# Patient Record
Sex: Male | Born: 1960
Health system: Southern US, Community
[De-identification: ages and names within clinical notes are randomized; demographics above are authoritative.]

## PROBLEM LIST (undated history)

## (undated) DIAGNOSIS — E7800 Pure hypercholesterolemia, unspecified: Secondary | ICD-10-CM

---

## 2000-12-22 ENCOUNTER — Emergency Department (HOSPITAL_COMMUNITY): Admission: EM | Admit: 2000-12-22 | Discharge: 2000-12-22 | Payer: Self-pay | Admitting: Emergency Medicine

## 2008-08-13 ENCOUNTER — Emergency Department (HOSPITAL_COMMUNITY): Admission: EM | Admit: 2008-08-13 | Discharge: 2008-08-13 | Payer: Self-pay | Admitting: Pulmonary Disease

## 2012-03-06 ENCOUNTER — Emergency Department (HOSPITAL_COMMUNITY)
Admission: EM | Admit: 2012-03-06 | Discharge: 2012-03-06 | Disposition: A | Discharge status: Home / Self Care | Payer: BC Managed Care – PPO | Attending: Emergency Medicine | Admitting: Emergency Medicine

## 2012-03-06 ENCOUNTER — Encounter (HOSPITAL_COMMUNITY): Payer: Self-pay | Admitting: *Deleted

## 2012-03-06 DIAGNOSIS — E7800 Pure hypercholesterolemia, unspecified: Secondary | ICD-10-CM | POA: Insufficient documentation

## 2012-03-06 DIAGNOSIS — Y998 Other external cause status: Secondary | ICD-10-CM | POA: Insufficient documentation

## 2012-03-06 DIAGNOSIS — S01111A Laceration without foreign body of right eyelid and periocular area, initial encounter: Secondary | ICD-10-CM

## 2012-03-06 DIAGNOSIS — W219XXA Striking against or struck by unspecified sports equipment, initial encounter: Secondary | ICD-10-CM | POA: Insufficient documentation

## 2012-03-06 DIAGNOSIS — S0180XA Unspecified open wound of other part of head, initial encounter: Secondary | ICD-10-CM | POA: Insufficient documentation

## 2012-03-06 DIAGNOSIS — Y9373 Activity, racquet and hand sports: Secondary | ICD-10-CM | POA: Insufficient documentation

## 2012-03-06 HISTORY — DX: Pure hypercholesterolemia, unspecified: E78.00

## 2012-03-06 MED ORDER — TETANUS-DIPHTH-ACELL PERTUSSIS 5-2.5-18.5 LF-MCG/0.5 IM SUSP
0.5000 mL | Freq: Once | INTRAMUSCULAR | Status: AC
  Administered 2012-03-06: 0.5 mL via INTRAMUSCULAR
  Filled 2012-03-06: qty 0.5

## 2012-03-06 NOTE — ED Notes (Signed)
Pt c/o hitting self in head with tennis racket; small laceration noted right eyebrow

## 2012-03-06 NOTE — ED Provider Notes (Signed)
Medical screening examination/treatment/procedure(s) were performed by non-physician practitioner and as supervising physician I was immediately available for consultation/collaboration.   Forbes Cellar, MD 03/06/12 [phone]

## 2012-03-06 NOTE — ED Provider Notes (Signed)
History     CSN: [id number]  Arrival date & time 03/06/12  2038   First MD Initiated Contact with Patient 03/06/12 2243      Chief Complaint  Patient presents with  . Head Laceration    (Consider location/radiation/quality/duration/timing/severity/associated sxs/prior treatment) HPI Patient presents emergency department with a laceration to the medial right eyebrow region.  He states that occurred earlier this evening while playing tennis.  Patient, states he is making is served when the racquet came down and hit him in the face.  Patient denies loss consciousness, blurred vision, headache, or dizziness.  Patient, states his tetanus shot may have been 5 years ago, but is unsure. Past Medical History  Diagnosis Date  . Hypercholesteremia     History reviewed. No pertinent past surgical history.  History reviewed. No pertinent family history.  History  Substance Use Topics  . Smoking status: Never Smoker   . Smokeless tobacco: Not on file  . Alcohol Use:      social      Review of Systems All other systems negative except as documented in the HPI. All pertinent positives and negatives as reviewed in the HPI.  Allergies  Penicillins  Home Medications   Current Outpatient Rx  Name Route Sig Dispense Refill  . ASPIRIN 81 MG PO CHEW Oral Chew 81 mg by mouth daily.    Marland Kitchen NIACIN ER (ANTIHYPERLIPIDEMIC) 500 MG PO TBCR Oral Take 1,000 mg by mouth at bedtime.    Marland Kitchen ROSUVASTATIN CALCIUM 10 MG PO TABS Oral Take 10 mg by mouth daily.      BP 123/68  Pulse 69  Temp(Src) 98.5 F (36.9 C) (Oral)  Resp 18  Wt 150 lb (68.04 kg)  SpO2 96%  Physical Exam  Nursing note and vitals reviewed. Constitutional: He is oriented to person, place, and time. He appears well-developed and well-nourished. No distress.  HENT:  Head: Normocephalic.    Eyes: Pupils are equal, round, and reactive to light.  Neurological: He is alert and oriented to person, place, and time.    ED Course    Procedures (including critical care time) LACERATION REPAIR Performed by: Carlyle Dolly Authorized by: Carlyle Dolly Consent: Verbal consent obtained. Risks and benefits: risks, benefits and alternatives were discussed Consent given by: patient Patient identity confirmed: provided demographic data Prepped and Draped in normal sterile fashion Wound explored  Laceration Location: Medial right eyebrow region  Laceration Length: 3cm  No Foreign Bodies seen or palpated  Anesthesia: local infiltration  Local anesthetic: none  Anesthetic total: na  Irrigation method: syringe Amount of cleaning: standard  Skin closure: Dermabond   Patient tolerance: Patient tolerated the procedure well with no immediate complications. The wound was not in the hair of his eyebrow.   MDM          Carlyle Dolly, PA-C 03/06/12 2336

## 2012-03-06 NOTE — Discharge Instructions (Signed)
The glue will come off on its own. Do not scrub over the wound.

## 2012-09-06 ENCOUNTER — Institutional Professional Consult (permissible substitution): Payer: BC Managed Care – PPO | Admitting: Cardiology

## 2012-09-12 ENCOUNTER — Encounter: Payer: Self-pay | Admitting: Cardiology

## 2012-09-12 ENCOUNTER — Telehealth: Payer: Self-pay | Admitting: Cardiology

## 2012-09-12 ENCOUNTER — Ambulatory Visit (INDEPENDENT_AMBULATORY_CARE_PROVIDER_SITE_OTHER): Payer: BC Managed Care – PPO | Admitting: Cardiology

## 2012-09-12 VITALS — BP 116/75 | HR 64 | Ht 68.0 in | Wt 162.4 lb

## 2012-09-12 DIAGNOSIS — R002 Palpitations: Secondary | ICD-10-CM

## 2012-09-12 DIAGNOSIS — E785 Hyperlipidemia, unspecified: Secondary | ICD-10-CM | POA: Insufficient documentation

## 2012-09-12 DIAGNOSIS — R55 Syncope and collapse: Secondary | ICD-10-CM

## 2012-09-12 NOTE — Progress Notes (Signed)
HPI The patient presents for evaluation of palpitations and dizziness. He has no prior cardiac history but has a significant family history with his father having heart disease starting in his 27s. He reports that about 6 weeks ago he had some tachycardia palpitations. This happened while he was active doing some yard work. He had this symptom off and on throughout the day. It resolved spontaneously but recurred about one week later.  Since that time about 5 weeks ago he has had no further tachypalpitations. He exercises routinely and he says he's not had any new chest pressure, neck or arm discomfort. He has had no PND or orthopnea. He's not had any new dyspnea with exertion. He's had no weight gain or edema.  Allergies  Allergen Reactions  . Penicillins     Current Outpatient Prescriptions  Medication Sig Dispense Refill  . aspirin 81 MG chewable tablet Chew 81 mg by mouth daily.      . niacin (NIASPAN) 500 MG CR tablet Take 1,000 mg by mouth at bedtime.      . rosuvastatin (CRESTOR) 10 MG tablet Take 10 mg by mouth daily.        Past Medical History  Diagnosis Date  . Hypercholesteremia     No past surgical history on file.  Family History  Problem Relation Age of Onset  . CAD Father 47    History   Social History  . Marital Status: Married    Spouse Name: N/A    Number of Children: 3  . Years of Education: N/A   Occupational History  .     Social History Main Topics  . Smoking status: Never Smoker   . Smokeless tobacco: Not on file  . Alcohol Use:      Comment: social  . Drug Use:   . Sexually Active:    Other Topics Concern  . Not on file   Social History Narrative  . No narrative on file    ROS:  Positive for dyspnea, palpitations, presyncope and dizziness. Otherwise as stated in the history of present illness and negative for all other systems.  PHYSICAL EXAM BP 116/75  Pulse 64  Ht 5\' 8"  (1.727 m)  Wt 162 lb 6.4 oz (73.664 kg)  BMI 24.69  kg/m2 GENERAL:  Well appearing HEENT:  Pupils equal round and reactive, fundi not visualized, oral mucosa unremarkable NECK:  No jugular venous distention, waveform within normal limits, carotid upstroke brisk and symmetric, no bruits, no thyromegaly LYMPHATICS:  No cervical, inguinal adenopathy LUNGS:  Clear to auscultation bilaterally BACK:  No CVA tenderness CHEST:  Unremarkable HEART:  PMI not displaced or sustained,S1 and S2 within normal limits, no S3, no S4, no clicks, no rubs, no murmurs ABD:  Flat, positive bowel sounds normal in frequency in pitch, no bruits, no rebound, no guarding, no midline pulsatile mass, no hepatomegaly, no splenomegaly EXT:  2 plus pulses throughout, no edema, no cyanosis no clubbing SKIN:  No rashes no nodules NEURO:  Cranial nerves II through XII grossly intact, motor grossly intact throughout PSYCH:  Cognitively intact, oriented to person place and time   EKG:   Sinus rhythm, rate 61 with sinus arrhythmia, axis within normal limits, intervals within normal limits, no acute ST-T wave changes. 09/12/2012  ASSESSMENT AND PLAN   PALPITATIONS Since these have resolved no further workup is suggested. She will let me know if they recur at which point he might need an event monitor.  DYSLIPIDEMIA  We had  a discussion about this as he has been hit and miss with his lipid-lowering medications. I would like to review the labs from his primary provider with a goal LDL less than 100 and HDL greater than 40 given his family history.  FAMILY HISTORY The patient has a family history of early onset coronary disease. Given his risk factors with this I would like to bring him back for stress testing. I will bring the patient back for a POET (Plain Old Exercise Test). This will allow me to screen for obstructive coronary disease, risk stratify and very importantly provide a prescription for exercise. I have also suggested a coronary calcium score.

## 2012-09-12 NOTE — Patient Instructions (Addendum)
The current medical regimen is effective;  continue present plan and medications.  Your physician has requested that you have an exercise tolerance test. For further information please visit [email or web address] Please also follow instruction sheet, as given.  Your physician has requested that you have CA score. Cardiac computed tomography (CT) is a painless test that uses an x-ray machine to take clear, detailed pictures of your heart. For further information please visit [email or web address] Please follow instruction sheet as given.  Follow up with Dr Antoine Poche will be based on results of the testing.

## 2012-09-12 NOTE — Telephone Encounter (Signed)
Will check with Merita Norton Lloyd-Fate to see if she called this pt about his appointment

## 2012-09-12 NOTE — Telephone Encounter (Signed)
Patient states that he is returning missed call.  Machine was full so he did not have message only saw missed call. Patient saw Dr.Hochrein this AM. / tgs

## 2012-09-15 NOTE — Telephone Encounter (Signed)
Cardiac CT Screening 10/10/12 @ 9am.

## 2012-10-10 ENCOUNTER — Ambulatory Visit (INDEPENDENT_AMBULATORY_CARE_PROVIDER_SITE_OTHER)
Admission: RE | Admit: 2012-10-10 | Discharge: 2012-10-10 | Disposition: A | Discharge status: Home / Self Care | Payer: BC Managed Care – PPO | Source: Ambulatory Visit | Attending: Cardiology | Admitting: Cardiology

## 2012-10-10 ENCOUNTER — Ambulatory Visit (INDEPENDENT_AMBULATORY_CARE_PROVIDER_SITE_OTHER): Payer: BC Managed Care – PPO | Admitting: Cardiology

## 2012-10-10 ENCOUNTER — Encounter: Payer: BC Managed Care – PPO | Admitting: Physician Assistant

## 2012-10-10 DIAGNOSIS — R55 Syncope and collapse: Secondary | ICD-10-CM

## 2012-10-10 DIAGNOSIS — R079 Chest pain, unspecified: Secondary | ICD-10-CM

## 2012-10-10 DIAGNOSIS — R002 Palpitations: Secondary | ICD-10-CM

## 2012-10-10 NOTE — Procedures (Signed)
Exercise Treadmill Test  Pre-Exercise Testing Evaluation Rhythm: normal sinus  Rate: 79     Test  Exercise Tolerance Test Ordering MD: Angelina Sheriff, MD  Interpreting MD: Angelina Sheriff, MD  Unique Test No: 1  Treadmill:  1  Indication for ETT: palpitations  Contraindication to ETT: No   Stress Modality: exercise - treadmill  Cardiac Imaging Performed: non   Protocol: standard Bruce - maximal  Max BP:  145/63  Max MPHR (bpm):  167 85% MPR (bpm):  142  MPHR obtained (bpm):  153 % MPHR obtained:  91  Reached 85% MPHR (min:sec):  11:00 Total Exercise Time (min-sec):  12:30  Workload in METS:  14.3 Borg Scale: 13  Reason ETT Terminated:  desired heart rate attained    ST Segment Analysis At Rest: normal ST segments - no evidence of significant ST depression With Exercise: no evidence of significant ST depression  Other Information Arrhythmia:  No Angina during ETT:  absent (0) Quality of ETT:  diagnostic  ETT Interpretation:  normal - no evidence of ischemia by ST analysis  Comments: The patient had an excellent exercise tolerance.  There was no chest pain.  There was an appropriate level of dyspnea.  There were no arrhythmias, a normal heart rate response and normal BP response.  There were no ischemic ST T wave changes and a normal heart rate recovery.  Recommendations: Negative adequate ETT.  No further testing is indicated.  Based on the above I gave the patient a prescription for exercise.

## 2012-10-27 ENCOUNTER — Telehealth: Payer: Self-pay | Admitting: Cardiology

## 2012-10-27 NOTE — Telephone Encounter (Signed)
Pt aware of result.

## 2012-10-27 NOTE — Telephone Encounter (Signed)
New Problem: ° ° ° °Patient returned your call.  Please call back. °

## 2012-10-27 NOTE — Telephone Encounter (Signed)
New Problem:    Patient called in returning a call about test results.  Please call back.

## 2012-10-27 NOTE — Telephone Encounter (Signed)
Left message for pt to call back - (CA score was 0)

## 2012-12-13 ENCOUNTER — Ambulatory Visit: Payer: BC Managed Care – PPO | Admitting: Cardiology

## 2012-12-19 ENCOUNTER — Telehealth: Payer: Self-pay | Admitting: Cardiology

## 2012-12-19 NOTE — Telephone Encounter (Signed)
Left message on voicemail OK to send results to the office

## 2012-12-19 NOTE — Telephone Encounter (Signed)
New Prob   Pt had new labwork done outside of [place], would like to know if he should send to [place] for his files. Would like to speak to nurse.

## 2013-01-03 ENCOUNTER — Encounter: Payer: Self-pay | Admitting: Cardiology

## 2014-04-28 IMAGING — CT CT HEART SCORING
1 series · 16 of 20 positions shown, 20 images · non-contrast
Comparison: None.

OVER-READ INTERPRETATION - CT CHEST

The following report is an over-read performed by radiologist Dr.
[DATE].  This over-read does not include interpretation of
cardiac or coronary anatomy or pathology.  The coronary calcium
score interpretation by the cardiologist is attached.
INDICATION: Risk stratification
The patient was scanned on a Siemens Sensation 16 slice scanner.
Axial nonconstrast images were done at 3mm intervals.  The data
was analyzed on a dedicated [employer] work station

[Series 3: calcium score · axial · 0.34mm/px · z∈[-236,-116]mm · 16 of 46 slices shown, 20 images]
[im 3/46  vessel]
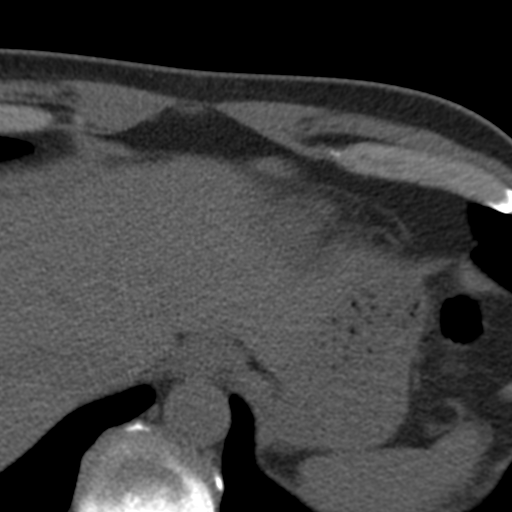
[im 3/46  lung]
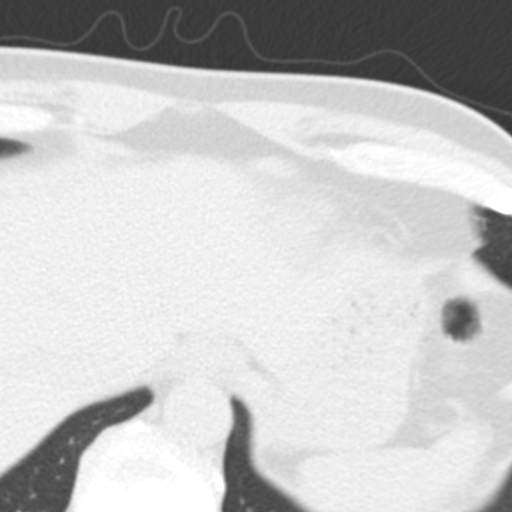
[im 5/46  vessel]
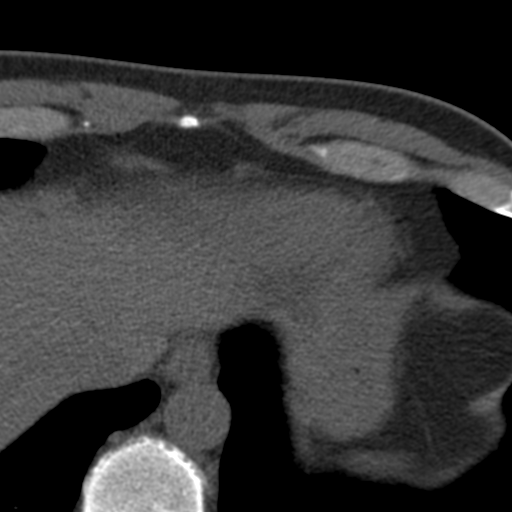
[im 8/46  vessel]
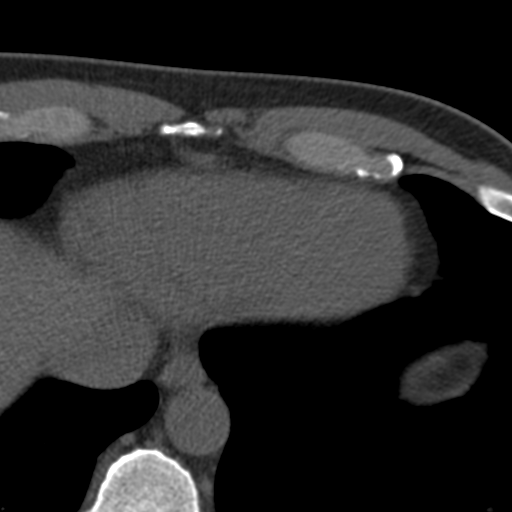
[im 10/46  vessel]
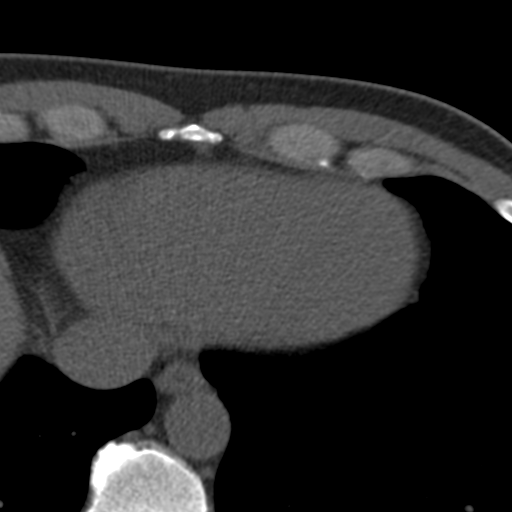
[im 15/46  vessel]
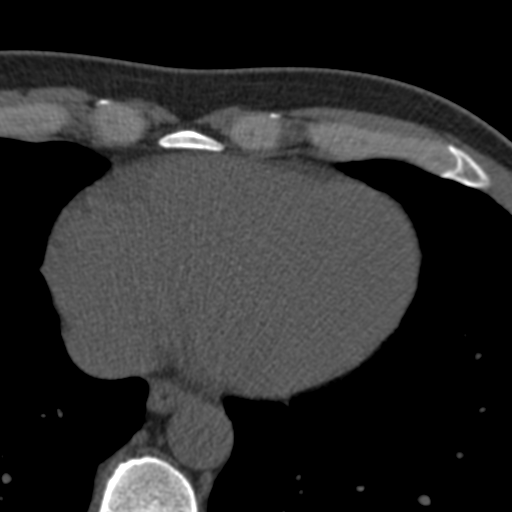
[im 15/46  lung]
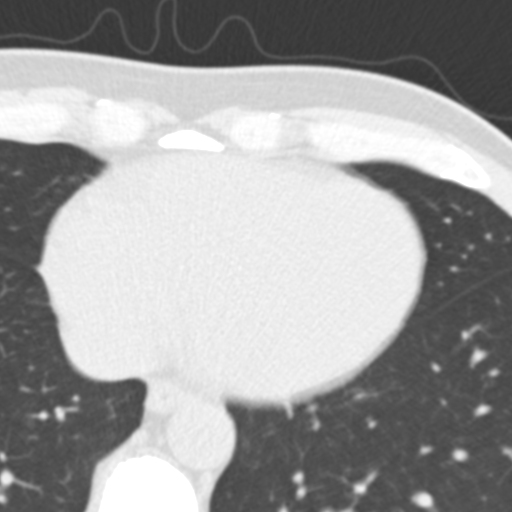
[im 17/46  vessel]
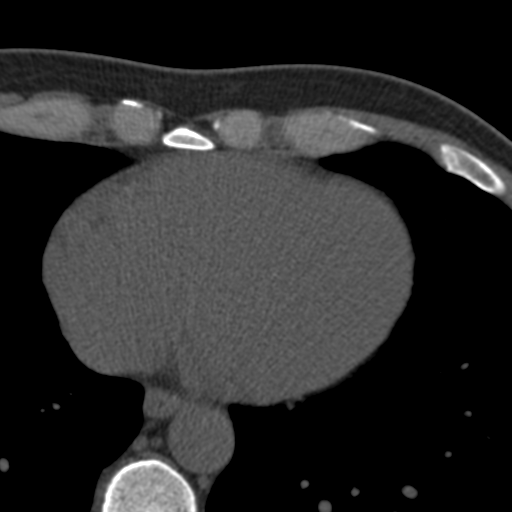
[im 19/46  vessel]
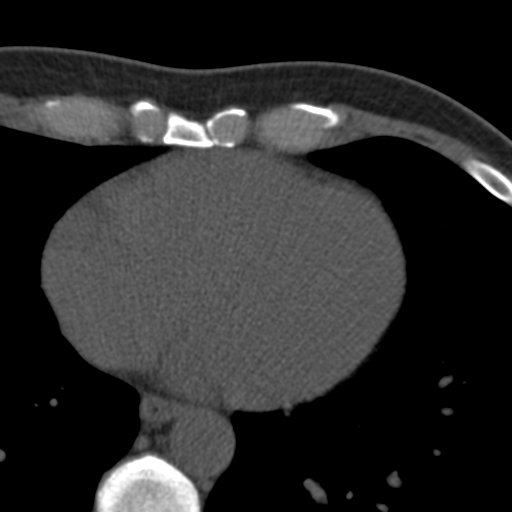
[im 22/46  vessel]
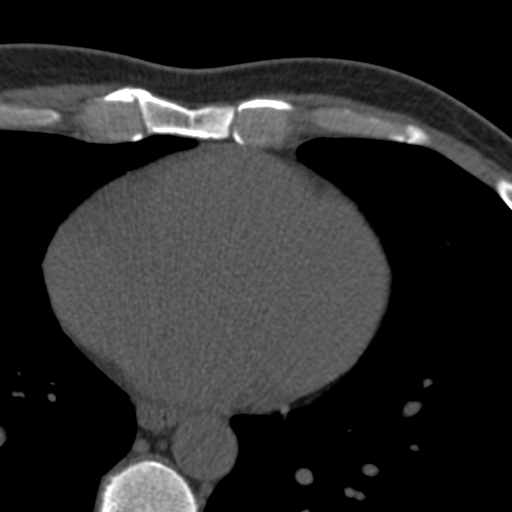
[im 24/46  vessel]
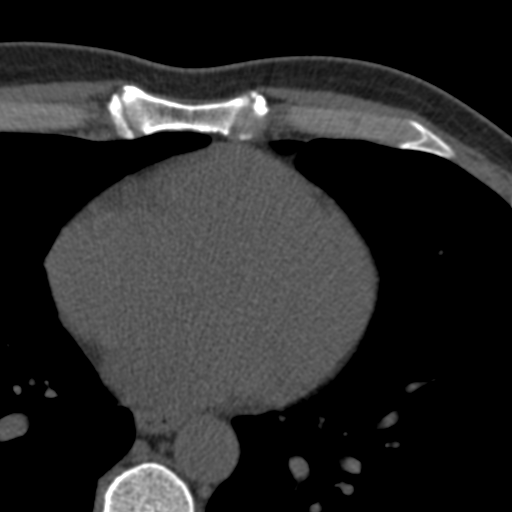
[im 24/46  lung]
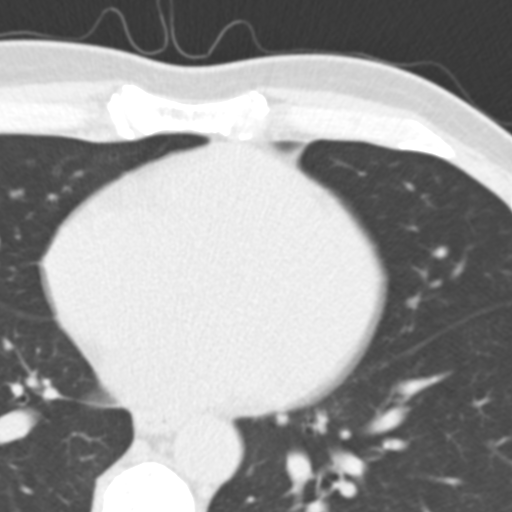
[im 27/46  vessel]
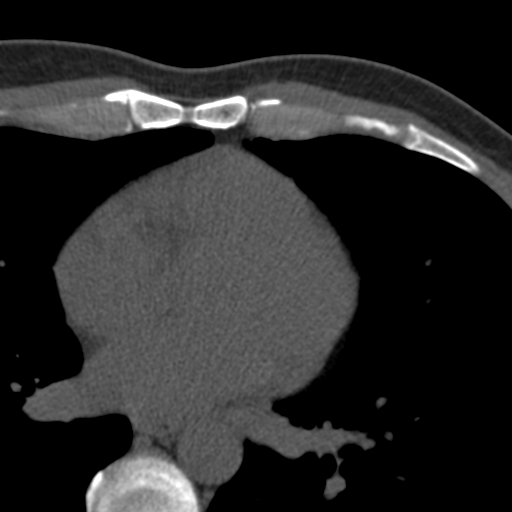
[im 29/46  vessel]
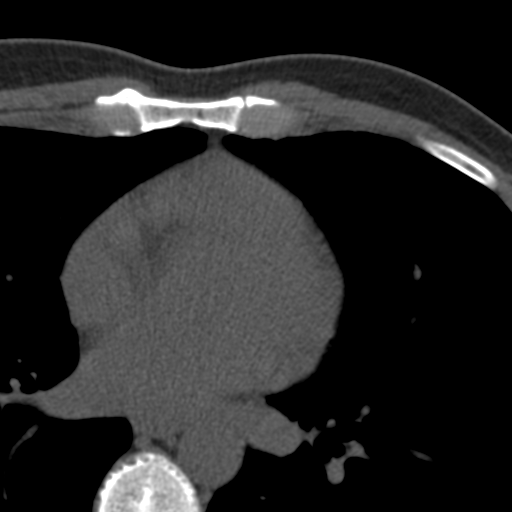
[im 31/46  vessel]
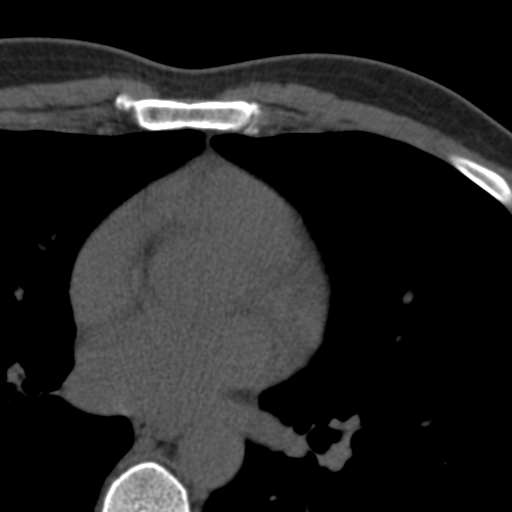
[im 36/46  vessel]
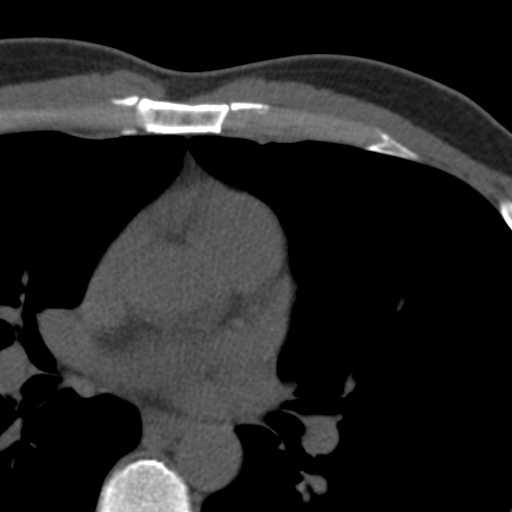
[im 36/46  lung]
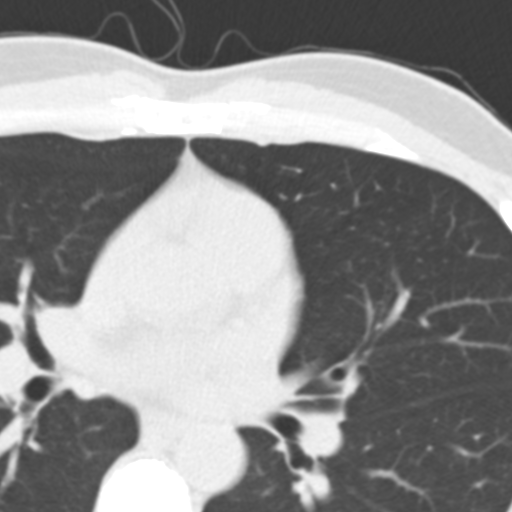
[im 38/46  vessel]
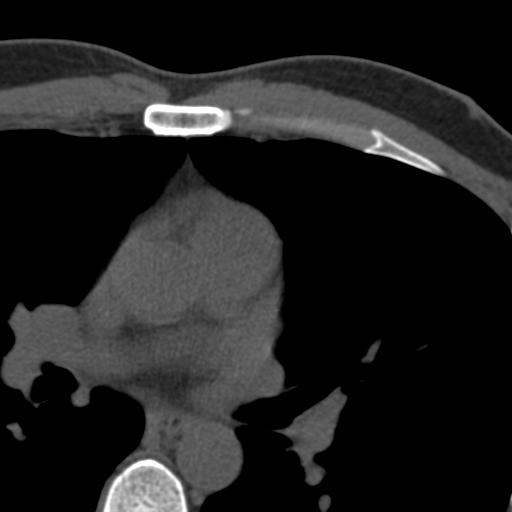
[im 41/46  vessel]
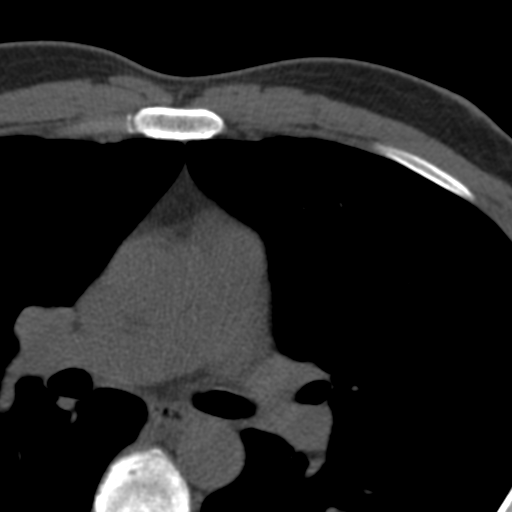
[im 43/46  vessel]
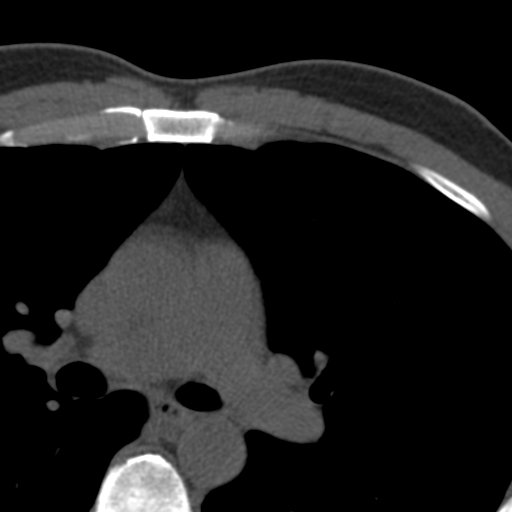

[16 of 20 positions shown; findings below may reference images not displayed]

FINDINGS: No adenopathy in the visualized lower mediastinum or
hila.  Visualized aorta is normal caliber.  Visualized lungs are
clear.  No pleural effusions. No acute bony abnormality.
IMPRESSION: No acute findings or significant extracardiac abnormality.
Coronary Calcium Score:
FINDINGS: There was coronary calcium
IMPRESSION: Calcium Score 0

## 2015-12-31 DIAGNOSIS — Z713 Dietary counseling and surveillance: Secondary | ICD-10-CM | POA: Diagnosis not present

## 2016-03-04 DIAGNOSIS — Z713 Dietary counseling and surveillance: Secondary | ICD-10-CM | POA: Diagnosis not present

## 2016-04-20 DIAGNOSIS — F4322 Adjustment disorder with anxiety: Secondary | ICD-10-CM | POA: Diagnosis not present

## 2016-04-29 DIAGNOSIS — Z713 Dietary counseling and surveillance: Secondary | ICD-10-CM | POA: Diagnosis not present

## 2016-07-08 DIAGNOSIS — Z713 Dietary counseling and surveillance: Secondary | ICD-10-CM | POA: Diagnosis not present

## 2016-09-02 DIAGNOSIS — H5213 Myopia, bilateral: Secondary | ICD-10-CM | POA: Diagnosis not present

## 2016-09-02 DIAGNOSIS — H40053 Ocular hypertension, bilateral: Secondary | ICD-10-CM | POA: Diagnosis not present

## 2016-09-10 DIAGNOSIS — Z713 Dietary counseling and surveillance: Secondary | ICD-10-CM | POA: Diagnosis not present

## 2016-09-14 DIAGNOSIS — Z0000 Encounter for general adult medical examination without abnormal findings: Secondary | ICD-10-CM | POA: Diagnosis not present

## 2016-09-14 DIAGNOSIS — Z23 Encounter for immunization: Secondary | ICD-10-CM | POA: Diagnosis not present

## 2016-09-14 DIAGNOSIS — Z125 Encounter for screening for malignant neoplasm of prostate: Secondary | ICD-10-CM | POA: Diagnosis not present

## 2016-09-14 DIAGNOSIS — J069 Acute upper respiratory infection, unspecified: Secondary | ICD-10-CM | POA: Diagnosis not present

## 2016-09-14 DIAGNOSIS — E7800 Pure hypercholesterolemia, unspecified: Secondary | ICD-10-CM | POA: Diagnosis not present

## 2016-09-14 DIAGNOSIS — J309 Allergic rhinitis, unspecified: Secondary | ICD-10-CM | POA: Diagnosis not present

## 2016-11-04 DIAGNOSIS — Z713 Dietary counseling and surveillance: Secondary | ICD-10-CM | POA: Diagnosis not present

## 2017-01-11 DIAGNOSIS — Z713 Dietary counseling and surveillance: Secondary | ICD-10-CM | POA: Diagnosis not present

## 2017-05-03 DIAGNOSIS — H6122 Impacted cerumen, left ear: Secondary | ICD-10-CM | POA: Diagnosis not present

## 2017-05-03 DIAGNOSIS — H9193 Unspecified hearing loss, bilateral: Secondary | ICD-10-CM | POA: Diagnosis not present

## 2017-05-19 DIAGNOSIS — L570 Actinic keratosis: Secondary | ICD-10-CM | POA: Diagnosis not present

## 2017-05-23 DIAGNOSIS — H903 Sensorineural hearing loss, bilateral: Secondary | ICD-10-CM | POA: Diagnosis not present

## 2017-06-13 DIAGNOSIS — F4322 Adjustment disorder with anxiety: Secondary | ICD-10-CM | POA: Diagnosis not present

## 2017-06-30 DIAGNOSIS — F4322 Adjustment disorder with anxiety: Secondary | ICD-10-CM | POA: Diagnosis not present

## 2017-07-21 DIAGNOSIS — F4322 Adjustment disorder with anxiety: Secondary | ICD-10-CM | POA: Diagnosis not present

## 2017-08-11 DIAGNOSIS — F4322 Adjustment disorder with anxiety: Secondary | ICD-10-CM | POA: Diagnosis not present

## 2017-09-02 DIAGNOSIS — F4322 Adjustment disorder with anxiety: Secondary | ICD-10-CM | POA: Diagnosis not present

## 2017-09-08 DIAGNOSIS — H5213 Myopia, bilateral: Secondary | ICD-10-CM | POA: Diagnosis not present

## 2017-09-08 DIAGNOSIS — H40053 Ocular hypertension, bilateral: Secondary | ICD-10-CM | POA: Diagnosis not present

## 2017-09-08 DIAGNOSIS — G43909 Migraine, unspecified, not intractable, without status migrainosus: Secondary | ICD-10-CM | POA: Diagnosis not present

## 2017-09-30 DIAGNOSIS — F4322 Adjustment disorder with anxiety: Secondary | ICD-10-CM | POA: Diagnosis not present

## 2017-10-04 DIAGNOSIS — Z1159 Encounter for screening for other viral diseases: Secondary | ICD-10-CM | POA: Diagnosis not present

## 2017-10-04 DIAGNOSIS — J309 Allergic rhinitis, unspecified: Secondary | ICD-10-CM | POA: Diagnosis not present

## 2017-10-04 DIAGNOSIS — Z23 Encounter for immunization: Secondary | ICD-10-CM | POA: Diagnosis not present

## 2017-10-04 DIAGNOSIS — E7800 Pure hypercholesterolemia, unspecified: Secondary | ICD-10-CM | POA: Diagnosis not present

## 2017-10-04 DIAGNOSIS — Z0000 Encounter for general adult medical examination without abnormal findings: Secondary | ICD-10-CM | POA: Diagnosis not present

## 2017-10-20 DIAGNOSIS — F4322 Adjustment disorder with anxiety: Secondary | ICD-10-CM | POA: Diagnosis not present

## 2017-11-10 DIAGNOSIS — F4322 Adjustment disorder with anxiety: Secondary | ICD-10-CM | POA: Diagnosis not present

## 2017-12-06 DIAGNOSIS — F4322 Adjustment disorder with anxiety: Secondary | ICD-10-CM | POA: Diagnosis not present

## 2017-12-29 DIAGNOSIS — F4322 Adjustment disorder with anxiety: Secondary | ICD-10-CM | POA: Diagnosis not present

## 2018-01-03 DIAGNOSIS — Z23 Encounter for immunization: Secondary | ICD-10-CM | POA: Diagnosis not present

## 2018-01-03 DIAGNOSIS — S61012A Laceration without foreign body of left thumb without damage to nail, initial encounter: Secondary | ICD-10-CM | POA: Diagnosis not present

## 2018-01-19 DIAGNOSIS — F4322 Adjustment disorder with anxiety: Secondary | ICD-10-CM | POA: Diagnosis not present

## 2018-02-09 DIAGNOSIS — F4322 Adjustment disorder with anxiety: Secondary | ICD-10-CM | POA: Diagnosis not present

## 2018-02-10 DIAGNOSIS — Z23 Encounter for immunization: Secondary | ICD-10-CM | POA: Diagnosis not present

## 2018-02-17 DIAGNOSIS — Z23 Encounter for immunization: Secondary | ICD-10-CM | POA: Diagnosis not present

## 2018-03-20 DIAGNOSIS — Z23 Encounter for immunization: Secondary | ICD-10-CM | POA: Diagnosis not present

## 2018-03-23 DIAGNOSIS — F4322 Adjustment disorder with anxiety: Secondary | ICD-10-CM | POA: Diagnosis not present

## 2018-03-29 DIAGNOSIS — K2900 Acute gastritis without bleeding: Secondary | ICD-10-CM | POA: Diagnosis not present

## 2018-05-04 DIAGNOSIS — F4322 Adjustment disorder with anxiety: Secondary | ICD-10-CM | POA: Diagnosis not present

## 2018-05-31 DIAGNOSIS — F4322 Adjustment disorder with anxiety: Secondary | ICD-10-CM | POA: Diagnosis not present

## 2018-06-29 DIAGNOSIS — F4322 Adjustment disorder with anxiety: Secondary | ICD-10-CM | POA: Diagnosis not present

## 2018-07-19 DIAGNOSIS — Z23 Encounter for immunization: Secondary | ICD-10-CM | POA: Diagnosis not present

## 2018-07-27 DIAGNOSIS — F4322 Adjustment disorder with anxiety: Secondary | ICD-10-CM | POA: Diagnosis not present

## 2018-08-28 DIAGNOSIS — F4322 Adjustment disorder with anxiety: Secondary | ICD-10-CM | POA: Diagnosis not present

## 2018-09-08 DIAGNOSIS — H5213 Myopia, bilateral: Secondary | ICD-10-CM | POA: Diagnosis not present

## 2018-09-08 DIAGNOSIS — G43909 Migraine, unspecified, not intractable, without status migrainosus: Secondary | ICD-10-CM | POA: Diagnosis not present

## 2018-09-11 DIAGNOSIS — Z23 Encounter for immunization: Secondary | ICD-10-CM | POA: Diagnosis not present

## 2018-09-11 DIAGNOSIS — L570 Actinic keratosis: Secondary | ICD-10-CM | POA: Diagnosis not present

## 2018-10-05 DIAGNOSIS — L853 Xerosis cutis: Secondary | ICD-10-CM | POA: Diagnosis not present

## 2018-10-05 DIAGNOSIS — H01005 Unspecified blepharitis left lower eyelid: Secondary | ICD-10-CM | POA: Diagnosis not present

## 2018-10-05 DIAGNOSIS — F4322 Adjustment disorder with anxiety: Secondary | ICD-10-CM | POA: Diagnosis not present

## 2018-10-16 DIAGNOSIS — R7301 Impaired fasting glucose: Secondary | ICD-10-CM | POA: Diagnosis not present

## 2018-10-16 DIAGNOSIS — Z0000 Encounter for general adult medical examination without abnormal findings: Secondary | ICD-10-CM | POA: Diagnosis not present

## 2018-10-16 DIAGNOSIS — J309 Allergic rhinitis, unspecified: Secondary | ICD-10-CM | POA: Diagnosis not present

## 2018-10-16 DIAGNOSIS — E7800 Pure hypercholesterolemia, unspecified: Secondary | ICD-10-CM | POA: Diagnosis not present

## 2018-10-16 DIAGNOSIS — Z125 Encounter for screening for malignant neoplasm of prostate: Secondary | ICD-10-CM | POA: Diagnosis not present

## 2018-11-02 DIAGNOSIS — F4322 Adjustment disorder with anxiety: Secondary | ICD-10-CM | POA: Diagnosis not present

## 2018-11-23 DIAGNOSIS — F4322 Adjustment disorder with anxiety: Secondary | ICD-10-CM | POA: Diagnosis not present

## 2018-12-14 DIAGNOSIS — F4322 Adjustment disorder with anxiety: Secondary | ICD-10-CM | POA: Diagnosis not present

## 2018-12-14 DIAGNOSIS — L570 Actinic keratosis: Secondary | ICD-10-CM | POA: Diagnosis not present

## 2018-12-14 DIAGNOSIS — L905 Scar conditions and fibrosis of skin: Secondary | ICD-10-CM | POA: Diagnosis not present

## 2019-01-04 DIAGNOSIS — F4322 Adjustment disorder with anxiety: Secondary | ICD-10-CM | POA: Diagnosis not present

## 2019-01-25 DIAGNOSIS — F4322 Adjustment disorder with anxiety: Secondary | ICD-10-CM | POA: Diagnosis not present

## 2019-02-15 DIAGNOSIS — F4322 Adjustment disorder with anxiety: Secondary | ICD-10-CM | POA: Diagnosis not present

## 2019-03-08 DIAGNOSIS — F4322 Adjustment disorder with anxiety: Secondary | ICD-10-CM | POA: Diagnosis not present

## 2019-03-29 DIAGNOSIS — F4322 Adjustment disorder with anxiety: Secondary | ICD-10-CM | POA: Diagnosis not present

## 2019-04-19 DIAGNOSIS — F4322 Adjustment disorder with anxiety: Secondary | ICD-10-CM | POA: Diagnosis not present

## 2019-05-09 DIAGNOSIS — F4322 Adjustment disorder with anxiety: Secondary | ICD-10-CM | POA: Diagnosis not present

## 2019-06-06 DIAGNOSIS — F4322 Adjustment disorder with anxiety: Secondary | ICD-10-CM | POA: Diagnosis not present

## 2019-06-07 DIAGNOSIS — Z23 Encounter for immunization: Secondary | ICD-10-CM | POA: Diagnosis not present

## 2019-06-27 DIAGNOSIS — F4322 Adjustment disorder with anxiety: Secondary | ICD-10-CM | POA: Diagnosis not present

## 2019-07-18 DIAGNOSIS — F4322 Adjustment disorder with anxiety: Secondary | ICD-10-CM | POA: Diagnosis not present

## 2019-07-27 DIAGNOSIS — L570 Actinic keratosis: Secondary | ICD-10-CM | POA: Diagnosis not present

## 2019-07-27 DIAGNOSIS — L814 Other melanin hyperpigmentation: Secondary | ICD-10-CM | POA: Diagnosis not present

## 2019-07-27 DIAGNOSIS — L821 Other seborrheic keratosis: Secondary | ICD-10-CM | POA: Diagnosis not present

## 2019-07-27 DIAGNOSIS — D485 Neoplasm of uncertain behavior of skin: Secondary | ICD-10-CM | POA: Diagnosis not present

## 2019-07-27 DIAGNOSIS — D225 Melanocytic nevi of trunk: Secondary | ICD-10-CM | POA: Diagnosis not present

## 2019-08-08 DIAGNOSIS — F4322 Adjustment disorder with anxiety: Secondary | ICD-10-CM | POA: Diagnosis not present

## 2019-08-29 DIAGNOSIS — F4322 Adjustment disorder with anxiety: Secondary | ICD-10-CM | POA: Diagnosis not present

## 2019-08-30 DIAGNOSIS — Z1211 Encounter for screening for malignant neoplasm of colon: Secondary | ICD-10-CM | POA: Diagnosis not present

## 2019-09-17 DIAGNOSIS — H5213 Myopia, bilateral: Secondary | ICD-10-CM | POA: Diagnosis not present

## 2019-09-17 DIAGNOSIS — H04123 Dry eye syndrome of bilateral lacrimal glands: Secondary | ICD-10-CM | POA: Diagnosis not present

## 2019-09-26 DIAGNOSIS — F4322 Adjustment disorder with anxiety: Secondary | ICD-10-CM | POA: Diagnosis not present

## 2019-10-03 DIAGNOSIS — L905 Scar conditions and fibrosis of skin: Secondary | ICD-10-CM | POA: Diagnosis not present

## 2019-10-03 DIAGNOSIS — L570 Actinic keratosis: Secondary | ICD-10-CM | POA: Diagnosis not present

## 2019-10-17 DIAGNOSIS — F4322 Adjustment disorder with anxiety: Secondary | ICD-10-CM | POA: Diagnosis not present

## 2019-11-08 DIAGNOSIS — F4322 Adjustment disorder with anxiety: Secondary | ICD-10-CM | POA: Diagnosis not present

## 2019-11-21 DIAGNOSIS — E7800 Pure hypercholesterolemia, unspecified: Secondary | ICD-10-CM | POA: Diagnosis not present

## 2019-11-21 DIAGNOSIS — Z0000 Encounter for general adult medical examination without abnormal findings: Secondary | ICD-10-CM | POA: Diagnosis not present

## 2019-11-21 DIAGNOSIS — J309 Allergic rhinitis, unspecified: Secondary | ICD-10-CM | POA: Diagnosis not present

## 2019-11-28 DIAGNOSIS — Z125 Encounter for screening for malignant neoplasm of prostate: Secondary | ICD-10-CM | POA: Diagnosis not present

## 2019-11-28 DIAGNOSIS — R7309 Other abnormal glucose: Secondary | ICD-10-CM | POA: Diagnosis not present

## 2019-11-28 DIAGNOSIS — E7800 Pure hypercholesterolemia, unspecified: Secondary | ICD-10-CM | POA: Diagnosis not present

## 2019-11-29 DIAGNOSIS — F4322 Adjustment disorder with anxiety: Secondary | ICD-10-CM | POA: Diagnosis not present

## 2019-12-20 DIAGNOSIS — F4322 Adjustment disorder with anxiety: Secondary | ICD-10-CM | POA: Diagnosis not present

## 2020-01-03 DIAGNOSIS — F4322 Adjustment disorder with anxiety: Secondary | ICD-10-CM | POA: Diagnosis not present

## 2020-01-25 DIAGNOSIS — F4322 Adjustment disorder with anxiety: Secondary | ICD-10-CM | POA: Diagnosis not present

## 2020-02-14 DIAGNOSIS — F4322 Adjustment disorder with anxiety: Secondary | ICD-10-CM | POA: Diagnosis not present

## 2020-02-20 DIAGNOSIS — Z713 Dietary counseling and surveillance: Secondary | ICD-10-CM | POA: Diagnosis not present

## 2020-03-05 DIAGNOSIS — F4322 Adjustment disorder with anxiety: Secondary | ICD-10-CM | POA: Diagnosis not present

## 2020-03-25 DIAGNOSIS — Z713 Dietary counseling and surveillance: Secondary | ICD-10-CM | POA: Diagnosis not present

## 2020-03-27 DIAGNOSIS — F4322 Adjustment disorder with anxiety: Secondary | ICD-10-CM | POA: Diagnosis not present

## 2020-04-17 DIAGNOSIS — F4322 Adjustment disorder with anxiety: Secondary | ICD-10-CM | POA: Diagnosis not present

## 2020-05-06 DIAGNOSIS — F4322 Adjustment disorder with anxiety: Secondary | ICD-10-CM | POA: Diagnosis not present

## 2020-05-13 DIAGNOSIS — Z713 Dietary counseling and surveillance: Secondary | ICD-10-CM | POA: Diagnosis not present

## 2020-05-29 DIAGNOSIS — F4322 Adjustment disorder with anxiety: Secondary | ICD-10-CM | POA: Diagnosis not present

## 2020-06-18 DIAGNOSIS — F4322 Adjustment disorder with anxiety: Secondary | ICD-10-CM | POA: Diagnosis not present

## 2020-06-18 DIAGNOSIS — Z23 Encounter for immunization: Secondary | ICD-10-CM | POA: Diagnosis not present

## 2020-06-26 DIAGNOSIS — Z713 Dietary counseling and surveillance: Secondary | ICD-10-CM | POA: Diagnosis not present

## 2020-07-10 DIAGNOSIS — F4322 Adjustment disorder with anxiety: Secondary | ICD-10-CM | POA: Diagnosis not present

## 2020-07-30 DIAGNOSIS — F4322 Adjustment disorder with anxiety: Secondary | ICD-10-CM | POA: Diagnosis not present

## 2020-08-12 DIAGNOSIS — Z713 Dietary counseling and surveillance: Secondary | ICD-10-CM | POA: Diagnosis not present

## 2020-08-14 DIAGNOSIS — D225 Melanocytic nevi of trunk: Secondary | ICD-10-CM | POA: Diagnosis not present

## 2020-08-14 DIAGNOSIS — L821 Other seborrheic keratosis: Secondary | ICD-10-CM | POA: Diagnosis not present

## 2020-08-14 DIAGNOSIS — L578 Other skin changes due to chronic exposure to nonionizing radiation: Secondary | ICD-10-CM | POA: Diagnosis not present

## 2020-08-14 DIAGNOSIS — D2261 Melanocytic nevi of right upper limb, including shoulder: Secondary | ICD-10-CM | POA: Diagnosis not present

## 2020-08-27 DIAGNOSIS — F4322 Adjustment disorder with anxiety: Secondary | ICD-10-CM | POA: Diagnosis not present

## 2020-09-17 DIAGNOSIS — F4322 Adjustment disorder with anxiety: Secondary | ICD-10-CM | POA: Diagnosis not present

## 2020-09-18 DIAGNOSIS — H04123 Dry eye syndrome of bilateral lacrimal glands: Secondary | ICD-10-CM | POA: Diagnosis not present

## 2020-09-18 DIAGNOSIS — H5213 Myopia, bilateral: Secondary | ICD-10-CM | POA: Diagnosis not present

## 2020-10-03 DIAGNOSIS — Z713 Dietary counseling and surveillance: Secondary | ICD-10-CM | POA: Diagnosis not present

## 2020-10-09 DIAGNOSIS — F4322 Adjustment disorder with anxiety: Secondary | ICD-10-CM | POA: Diagnosis not present

## 2020-10-29 DIAGNOSIS — F4322 Adjustment disorder with anxiety: Secondary | ICD-10-CM | POA: Diagnosis not present

## 2020-11-19 DIAGNOSIS — F4322 Adjustment disorder with anxiety: Secondary | ICD-10-CM | POA: Diagnosis not present

## 2020-12-02 DIAGNOSIS — Z713 Dietary counseling and surveillance: Secondary | ICD-10-CM | POA: Diagnosis not present

## 2020-12-11 DIAGNOSIS — F4322 Adjustment disorder with anxiety: Secondary | ICD-10-CM | POA: Diagnosis not present

## 2021-01-01 DIAGNOSIS — F4322 Adjustment disorder with anxiety: Secondary | ICD-10-CM | POA: Diagnosis not present

## 2021-01-01 DIAGNOSIS — J309 Allergic rhinitis, unspecified: Secondary | ICD-10-CM | POA: Diagnosis not present

## 2021-01-01 DIAGNOSIS — Z125 Encounter for screening for malignant neoplasm of prostate: Secondary | ICD-10-CM | POA: Diagnosis not present

## 2021-01-01 DIAGNOSIS — R7303 Prediabetes: Secondary | ICD-10-CM | POA: Diagnosis not present

## 2021-01-01 DIAGNOSIS — E7800 Pure hypercholesterolemia, unspecified: Secondary | ICD-10-CM | POA: Diagnosis not present

## 2021-01-01 DIAGNOSIS — Z0000 Encounter for general adult medical examination without abnormal findings: Secondary | ICD-10-CM | POA: Diagnosis not present

## 2021-01-22 DIAGNOSIS — F4322 Adjustment disorder with anxiety: Secondary | ICD-10-CM | POA: Diagnosis not present

## 2021-02-03 DIAGNOSIS — D2272 Melanocytic nevi of left lower limb, including hip: Secondary | ICD-10-CM | POA: Diagnosis not present

## 2021-02-03 DIAGNOSIS — D225 Melanocytic nevi of trunk: Secondary | ICD-10-CM | POA: Diagnosis not present

## 2021-02-03 DIAGNOSIS — D2262 Melanocytic nevi of left upper limb, including shoulder: Secondary | ICD-10-CM | POA: Diagnosis not present

## 2021-02-03 DIAGNOSIS — D2261 Melanocytic nevi of right upper limb, including shoulder: Secondary | ICD-10-CM | POA: Diagnosis not present

## 2021-02-06 ENCOUNTER — Encounter: Payer: Self-pay | Admitting: *Deleted

## 2021-02-11 ENCOUNTER — Other Ambulatory Visit: Payer: Self-pay

## 2021-02-11 ENCOUNTER — Ambulatory Visit (INDEPENDENT_AMBULATORY_CARE_PROVIDER_SITE_OTHER): Payer: BC Managed Care – PPO

## 2021-02-11 ENCOUNTER — Ambulatory Visit: Payer: BC Managed Care – PPO | Admitting: Cardiology

## 2021-02-11 ENCOUNTER — Encounter: Payer: Self-pay | Admitting: Cardiology

## 2021-02-11 VITALS — BP 133/73 | HR 67 | Ht 67.0 in | Wt 159.4 lb

## 2021-02-11 DIAGNOSIS — I4891 Unspecified atrial fibrillation: Secondary | ICD-10-CM

## 2021-02-11 NOTE — Progress Notes (Unsigned)
Patient enrolled for [employer] to mail a 14 day ZIO XT long term holter monitor to his home. 

## 2021-02-11 NOTE — Progress Notes (Signed)
Cardiology Office Note   Date:  02/11/2021   ID:  Bryan Ramirez, DOB [birth year], MRN [id number]  PCP:  Elias Else, MD  Cardiologist:   None Referring:  Self  No chief complaint on file.     History of Present Illness: Bryan Ramirez is a 60 y.o. male who presents for evaluation of palpitations.  He is Apple Watch has been suggesting atrial fibrillation and today was able to review some strips which confirmed this.   He said that the first episode recorded on his watch he has been wearing for about a year was in October.  He did not have any others until April and May when he has had several reports of fibrillation.  He says he will feel his heart skipping like he has over the years.  He might get a little more short of breath if he cannot do something like walk up the incline on his driveway.  His heartbeat is somewhat erratic when it is happening.  He has not had any presyncope or syncope.  He does not think he can bring it on other than he notices it more when he is fatigued at night.  He exercises every day and he feels well with this.  He is not able to bring on any of the symptoms.  He denies any chest pressure, neck or arm discomfort.  He has had no new PND or orthopnea.  He is otherwise felt well.  He did have palpitations March 2013.  No dysrhythmia was identified.  He also has a family history of early coronary artery disease.  He had a negative POET (Plain Old Exercise Treadmill) in 2014.    He had a negative calcium score at that time.     Past Medical History:  Diagnosis Date  . Hypercholesteremia     History reviewed. No pertinent surgical history.   Current Outpatient Medications  Medication Sig Dispense Refill  . aspirin EC 81 MG tablet Take 81 mg by mouth daily. Swallow whole.    . rosuvastatin (CRESTOR) 10 MG tablet Take 10 mg by mouth daily.     No current facility-administered medications for this visit.    Allergies:   Penicillins    Social  History:  The patient  reports that he has never smoked. He has never used smokeless tobacco. He reports previous alcohol use. He reports that he does not use drugs.   Family History:  The patient's family history includes CAD (age of onset: 47) in his father.    ROS:  Please see the history of present illness.   Otherwise, review of systems are positive for none.   All other systems are reviewed and negative.    PHYSICAL EXAM: VS:  BP 133/73   Pulse 67   Ht 5\' 7"  (1.702 m)   Wt 159 lb 6.4 oz (72.3 kg)   SpO2 95%   BMI 24.97 kg/m  , BMI Body mass index is 24.97 kg/m. GENERAL:  Well appearin PSYCH:  Cognitively intact, oriented to person place and time   EKG:  EKG is ordered today. The ekg ordered today demonstrates    Recent Labs: No results found for requested labs within last 8760 hours.    Lipid Panel No results found for: CHOL, TRIG, HDL, CHOLHDL, VLDL, LDLCALC, LDLDIRECT    Wt Readings from Last 3 Encounters:  02/11/21 159 lb 6.4 oz (72.3 kg)  09/12/12 162 lb 6.4 oz (73.7 kg)  03/06/12 150 lb (  68 kg)      Other studies Reviewed: Additional studies/ records that were reviewed today include: Review of wearable recordings Review of the above records demonstrates:  Please see elsewhere in the note.     ASSESSMENT AND PLAN:  ATRIAL FIB: He is having paroxysmal atrial fibrillation and is somewhat symptomatic.  I am going to start with an echocardiogram.  He is going to get a TSH.  Bryan Ramirez has a CHA2DS2 - VASc score of 0.  For now I am going to start him on aspirin.  He can get a 2-week monitor to confirm the diagnosis.  Likely I will treat him with as needed flecainide unless he is having frequent symptoms in which case it might be scheduled daily.  I would have a low threshold if this failed to send him for ablation.  FAMILY HISTORY OF FIRST DEGREE RELATIVE WITH EARLY HD:    I will send him for coronary calcium score.   Current medicines are reviewed  at length with the patient today.  The patient does not have concerns regarding medicines.  The following changes have been made:   As above  Labs/ tests ordered today include:   Orders Placed This Encounter  Procedures  . CT CARDIAC SCORING (SELF PAY ONLY)  . TSH  . LONG TERM MONITOR (3-14 DAYS)  . EKG 12-Lead  . ECHOCARDIOGRAM COMPLETE     Disposition:   FU with after the above studies.   Signed, Rollene Rotunda, MD  02/11/2021 12:40 PM    [facility]

## 2021-02-11 NOTE — Patient Instructions (Addendum)
Medication Instructions:  START ASPIRIN 81 MG DAILY   *If you need a refill on your cardiac medications before your next appointment, please call your pharmacy*  Lab Work: TSH TODAY   If you have labs (blood work) drawn today and your tests are completely normal, you will receive your results only by: Marland Kitchen MyChart Message (if you have MyChart) OR . A paper copy in the mail If you have any lab test that is abnormal or we need to change your treatment, we will call you to review the results.  Testing/Procedures: Your physician has requested that you have an echocardiogram. Echocardiography is a painless test that uses sound waves to create images of your heart. It provides your doctor with information about the size and shape of your heart and how well your heart's chambers and valves are working. This procedure takes approximately one hour. There are no restrictions for this procedure.   CALCIUM SCORE - THIS WILL BE $99 OUT OF POCKET   ZIO 14 DAY MONITOR  Follow-Up: At [facility], you and your health needs are our priority.  As part of our continuing mission to provide you with exceptional heart care, we have created designated Provider Care Teams.  These Care Teams include your primary Cardiologist (physician) and Advanced Practice Providers (APPs -  Physician Assistants and Nurse Practitioners) who all work together to provide you with the care you need, when you need it.  We recommend signing up for the patient portal called "MyChart".  Sign up information is provided on this After Visit Summary.  MyChart is used to connect with patients for Virtual Visits (Telemedicine).  Patients are able to view lab/test results, encounter notes, upcoming appointments, etc.  Non-urgent messages can be sent to your provider as well.   To learn more about what you can do with MyChart, go to [email or web address].    Your next appointment:   AFTER ALL OF YOUR TESTING HAS BEEN COMPLETED   Other  Instructions  ZIO XT- Long Term Monitor Instructions   Your physician has requested you wear a ZIO patch monitor for _14__ days.  This is a single patch monitor.   [employer] supplies one patch monitor per enrollment. Additional stickers are not available. Please do not apply patch if you will be having a Nuclear Stress Test, Echocardiogram, Cardiac CT, MRI, or Chest Xray during the period you would be wearing the monitor. The patch cannot be worn during these tests. You cannot remove and re-apply the ZIO XT patch monitor.  Your ZIO patch monitor will be sent Fed Ex from [employer] directly to your home address. It may take 3-5 days to receive your monitor after you have been enrolled.  Once you have received your monitor, please review the enclosed instructions. Your monitor has already been registered assigning a specific monitor serial # to you.  Billing and Patient Assistance Program Information   We have supplied [employer] with any of your insurance information on file for billing purposes. [employer] offers a sliding scale Patient Assistance Program for patients that do not have insurance, or whose insurance does not completely cover the cost of the ZIO monitor.   You must apply for the Patient Assistance Program to qualify for this discounted rate.     To apply, please call [employer] at [phone], select option 4, then select option 2, and ask to apply for Patient Assistance Program.  Meredeth Ide will ask your household income, and how many people are in your household.  They will quote your out-of-pocket cost based on that information.  [employer] will also be able to set up a 90-month, interest-free payment plan if needed.  Applying the monitor   Shave hair from upper left chest.  Hold abrader disc by orange tab. Rub abrader in 40 strokes over the upper left chest as indicated in your monitor instructions.  Clean area with 4 enclosed alcohol pads. Let dry.  Apply patch as indicated in monitor  instructions. Patch will be placed under collarbone on left side of chest with arrow pointing upward.  Rub patch adhesive wings for 2 minutes. Remove white label marked "1". Remove the white label marked "2". Rub patch adhesive wings for 2 additional minutes.  While looking in a mirror, press and release button in center of patch. A small green light will flash 3-4 times. This will be your only indicator that the monitor has been turned on. ?  Do not shower for the first 24 hours. You may shower after the first 24 hours.  Press the button if you feel a symptom. You will hear a small click. Record Date, Time and Symptom in the Patient Logbook.  When you are ready to remove the patch, follow instructions on the last 2 pages of the Patient Logbook. Stick patch monitor onto the last page of Patient Logbook.  Place Patient Logbook in the blue and white box.  Use locking tab on box and tape box closed securely.  The blue and white box has prepaid postage on it. Please place it in the mailbox as soon as possible. Your physician should have your test results approximately 7 days after the monitor has been mailed back to [facility].  Call [facility] Customer Care at [phone] if you have questions regarding your ZIO XT patch monitor. Call them immediately if you see an orange light blinking on your monitor.  If your monitor falls off in less than 4 days, contact our Monitor department at [phone]. ?If your monitor becomes loose or falls off after 4 days call [employer] at [phone] for suggestions on securing your monitor.?  Echocardiogram An echocardiogram is a test that uses sound waves (ultrasound) to produce images of the heart. Images from an echocardiogram can provide important information about:  Heart size and shape.  The size and thickness and movement of your heart's walls.  Heart muscle function and strength.  Heart valve function or if you have stenosis. Stenosis is when the  heart valves are too narrow.  If blood is flowing backward through the heart valves (regurgitation).  A tumor or infectious growth around the heart valves.  Areas of heart muscle that are not working well because of poor blood flow or injury from a heart attack.  Aneurysm detection. An aneurysm is a weak or damaged part of an artery wall. The wall bulges out from the normal force of blood pumping through the body. Tell a health care provider about:  Any allergies you have.  All medicines you are taking, including vitamins, herbs, eye drops, creams, and over-the-counter medicines.  Any blood disorders you have.  Any surgeries you have had.  Any medical conditions you have.  Whether you are pregnant or may be pregnant. What are the risks? Generally, this is a safe test. However, problems may occur, including an allergic reaction to dye (contrast) that may be used during the test. What happens before the test? No specific preparation is needed. You may eat and drink normally. What happens during the test?  You will take off your clothes from the waist up and put on a hospital gown.  Electrodes or electrocardiogram (ECG)patches may be placed on your chest. The electrodes or patches are then connected to a device that monitors your heart rate and rhythm.  You will lie down on a table for an ultrasound exam. A gel will be applied to your chest to help sound waves pass through your skin.  A handheld device, called a transducer, will be pressed against your chest and moved over your heart. The transducer produces sound waves that travel to your heart and bounce back (or "echo" back) to the transducer. These sound waves will be captured in real-time and changed into images of your heart that can be viewed on a video monitor. The images will be recorded on a computer and reviewed by your health care provider.  You may be asked to change positions or hold your breath for a short time. This  makes it easier to get different views or better views of your heart.  In some cases, you may receive contrast through an IV in one of your veins. This can improve the quality of the pictures from your heart. The procedure may vary among health care providers and hospitals.   What can I expect after the test? You may return to your normal, everyday life, including diet, activities, and medicines, unless your health care provider tells you not to do that. Follow these instructions at home:  It is up to you to get the results of your test. Ask your health care provider, or the department that is doing the test, when your results will be ready.  Keep all follow-up visits. This is important. Summary  An echocardiogram is a test that uses sound waves (ultrasound) to produce images of the heart.  Images from an echocardiogram can provide important information about the size and shape of your heart, heart muscle function, heart valve function, and other possible heart problems.  You do not need to do anything to prepare before this test. You may eat and drink normally.  After the echocardiogram is completed, you may return to your normal, everyday life, unless your health care provider tells you not to do that. This information is not intended to replace advice given to you by your health care provider. Make sure you discuss any questions you have with your health care provider. Document Revised: 05/06/2020 Document Reviewed: 05/06/2020 Elsevier Patient Education  2021 [employer].

## 2021-02-12 DIAGNOSIS — F4322 Adjustment disorder with anxiety: Secondary | ICD-10-CM | POA: Diagnosis not present

## 2021-02-12 LAB — TSH: TSH: 1.96 u[IU]/mL (ref 0.450–4.500)

## 2021-02-14 DIAGNOSIS — I4891 Unspecified atrial fibrillation: Secondary | ICD-10-CM | POA: Diagnosis not present

## 2021-03-04 DIAGNOSIS — F4322 Adjustment disorder with anxiety: Secondary | ICD-10-CM | POA: Diagnosis not present

## 2021-03-12 ENCOUNTER — Other Ambulatory Visit: Payer: Self-pay

## 2021-03-12 ENCOUNTER — Ambulatory Visit (INDEPENDENT_AMBULATORY_CARE_PROVIDER_SITE_OTHER)
Admission: RE | Admit: 2021-03-12 | Discharge: 2021-03-12 | Disposition: A | Discharge status: Home / Self Care | Payer: Self-pay | Source: Ambulatory Visit | Attending: Cardiology | Admitting: Cardiology

## 2021-03-12 ENCOUNTER — Ambulatory Visit (HOSPITAL_COMMUNITY): Payer: BC Managed Care – PPO | Attending: Internal Medicine

## 2021-03-12 DIAGNOSIS — I4891 Unspecified atrial fibrillation: Secondary | ICD-10-CM

## 2021-03-12 LAB — ECHOCARDIOGRAM COMPLETE
Area-P 1/2: 2.22 cm2
S' Lateral: 3.4 cm

## 2021-03-22 NOTE — Progress Notes (Signed)
Cardiology Office Note   Date:  03/24/2021   ID:  Bryan Ramirez, DOB [birth year], MRN [id number]  PCP:  Elias Else, MD  Cardiologist:   None Referring:  Self  No chief complaint on file.     History of Present Illness: Bryan Ramirez is a 60 y.o. male who presents for evaluation of atiral fib.  Since I last saw him he wore a monitor which confirmed the rhythm from his wearable which was atrial fib.  He has a significant symptomatic burden of this.  He had an echo which was unremarkable.  Coronary calcium was minimally elevated.    He called me recently because he was having increasing palpitations.  These were happening many times a day lasting a few minutes at a time.  His wearable did demonstrate atrial fibrillation.  He has been under a lot of stress as he is moving to [employer] and changing jobs.  These have now abated to some degree.  He is having them rarely just in the last week or so.  He is moving tomorrow.  He has not been having any chest pressure, neck or arm discomfort.  Is not having any new shortness of breath, PND or orthopnea.  He is not having any weight gain or edema.     Past Medical History:  Diagnosis Date   Hypercholesteremia     No past surgical history on file.   Current Outpatient Medications  Medication Sig Dispense Refill   aspirin EC 81 MG tablet Take 81 mg by mouth daily. Swallow whole.     Black Pepper-Turmeric (TURMERIC CURCUMIN) 01-999 MG CAPS 1 capsule     Calcium Carb-Cholecalciferol (CALCIUM 1000 + D) 1000-800 MG-UNIT TABS 1 chew     fluticasone (FLONASE) 50 MCG/ACT nasal spray Place 1 spray into both nostrils 2 (two) times daily.     loratadine (CLARITIN) 10 MG tablet 1 tablet     metoprolol tartrate (LOPRESSOR) 25 MG tablet Take 1 tablet (25 mg total) by mouth 2 (two) times daily. 180 tablet 3   Multiple Vitamins-Minerals (MULTIVITAMIN ADULT EXTRA C PO) 1 tablet     Omega-3 Fatty Acids (FISH OIL) 1000 MG CAPS 1 capsule      rosuvastatin (CRESTOR) 10 MG tablet Take 10 mg by mouth daily.     No current facility-administered medications for this visit.    Allergies:   Penicillins    ROS:  Please see the history of present illness.   Otherwise, review of systems are positive for none.   All other systems are reviewed and negative.    PHYSICAL EXAM: VS:  BP 130/70   Pulse 70   Ht 5\' 7"  (1.702 m)   Wt 156 lb (70.8 kg)   SpO2 97%   BMI 24.43 kg/m  , BMI Body mass index is 24.43 kg/m. GENERAL:  Well appearing NECK:  No jugular venous distention, waveform within normal limits, carotid upstroke brisk and symmetric, no bruits, no thyromegaly LUNGS:  Clear to auscultation bilaterally CHEST:  Unremarkable HEART:  PMI not displaced or sustained,S1 and S2 within normal limits, no S3, no S4, no clicks, no rubs, no murmurs ABD:  Flat, positive bowel sounds normal in frequency in pitch, no bruits, no rebound, no guarding, no midline pulsatile mass, no hepatomegaly, no splenomegaly EXT:  2 plus pulses throughout, no edema, no cyanosis no clubbing   EKG:  EKG is not ordered today. NA    Recent Labs: 02/11/2021: TSH 1.960  Lipid Panel No results found for: CHOL, TRIG, HDL, CHOLHDL, VLDL, LDLCALC, LDLDIRECT    Wt Readings from Last 3 Encounters:  03/24/21 156 lb (70.8 kg)  02/11/21 159 lb 6.4 oz (72.3 kg)  09/12/12 162 lb 6.4 oz (73.7 kg)      Other studies Reviewed: Additional studies/ records that were reviewed today include:   Monitor, calcium score and echo Review of the above records demonstrates:  Please see elsewhere in the note.     ASSESSMENT AND PLAN:  ATRIAL FIB: He is having paroxysmal atrial fibrillation and is somewhat symptomatic.  I am going to start with an echocardiogram.  He is going to get a TSH.  Mr. Bryan Ramirez has a CHA2DS2 - VASc score of 0.    We talked about this at length.  I would have him suggesting a referral to our EP for consideration of ablation.  However,  symptoms seem to be a little better without the trigger of stress.   Also he is moving out of town.  I discussed with him establishing with an EP in [place].  For now I am going to give him a pill in pocket beta-blocker to take if he has increased palpitations.   ELEVATED CORONARY CALCIUM:    This is very mild and he has no symptoms.   No change in therapy  Current medicines are reviewed at length with the patient today.  The patient does not have concerns regarding medicines.  The following changes have been made:   None  Labs/ tests ordered today include: None  No orders of the defined types were placed in this encounter.    Disposition:   FU with EP in [place].      Signed, Rollene Rotunda, MD  03/24/2021 5:20 PM    [facility]

## 2021-03-24 ENCOUNTER — Other Ambulatory Visit: Payer: Self-pay

## 2021-03-24 ENCOUNTER — Encounter: Payer: Self-pay | Admitting: Cardiology

## 2021-03-24 ENCOUNTER — Ambulatory Visit (INDEPENDENT_AMBULATORY_CARE_PROVIDER_SITE_OTHER): Payer: BC Managed Care – PPO | Admitting: Cardiology

## 2021-03-24 VITALS — BP 130/70 | HR 70 | Ht 67.0 in | Wt 156.0 lb

## 2021-03-24 DIAGNOSIS — I480 Paroxysmal atrial fibrillation: Secondary | ICD-10-CM | POA: Diagnosis not present

## 2021-03-24 DIAGNOSIS — R931 Abnormal findings on diagnostic imaging of heart and coronary circulation: Secondary | ICD-10-CM

## 2021-03-24 MED ORDER — METOPROLOL TARTRATE 25 MG PO TABS: 25.0000 mg | ORAL_TABLET | Freq: Two times a day (BID) | ORAL | 3 refills | Status: DC

## 2021-03-24 NOTE — Patient Instructions (Signed)
Medication Instructions:   TAKE METOPROLOL 25 MG TWICE DAILY   *If you need a refill on your cardiac medications before your next appointment, please call your pharmacy*   Follow-Up: At [facility], you and your health needs are our priority.  As part of our continuing mission to provide you with exceptional heart care, we have created designated Provider Care Teams.  These Care Teams include your primary Cardiologist (physician) and Advanced Practice Providers (APPs -  Physician Assistants and Nurse Practitioners) who all work together to provide you with the care you need, when you need it.  We recommend signing up for the patient portal called "MyChart".  Sign up information is provided on this After Visit Summary.  MyChart is used to connect with patients for Virtual Visits (Telemedicine).  Patients are able to view lab/test results, encounter notes, upcoming appointments, etc.  Non-urgent messages can be sent to your provider as well.   To learn more about what you can do with MyChart, go to [email or web address].    Your next appointment:    AS NEEDED

## 2021-04-02 DIAGNOSIS — F4322 Adjustment disorder with anxiety: Secondary | ICD-10-CM | POA: Diagnosis not present

## 2021-04-30 DIAGNOSIS — F4322 Adjustment disorder with anxiety: Secondary | ICD-10-CM | POA: Diagnosis not present

## 2021-06-04 DIAGNOSIS — F4322 Adjustment disorder with anxiety: Secondary | ICD-10-CM | POA: Diagnosis not present

## 2021-07-09 DIAGNOSIS — F4322 Adjustment disorder with anxiety: Secondary | ICD-10-CM | POA: Diagnosis not present

## 2021-08-12 DIAGNOSIS — F4322 Adjustment disorder with anxiety: Secondary | ICD-10-CM | POA: Diagnosis not present

## 2021-09-17 DIAGNOSIS — F4322 Adjustment disorder with anxiety: Secondary | ICD-10-CM | POA: Diagnosis not present

## 2021-10-20 DIAGNOSIS — F4322 Adjustment disorder with anxiety: Secondary | ICD-10-CM | POA: Diagnosis not present

## 2021-11-02 DIAGNOSIS — E782 Mixed hyperlipidemia: Secondary | ICD-10-CM | POA: Diagnosis not present

## 2021-11-02 DIAGNOSIS — Z0000 Encounter for general adult medical examination without abnormal findings: Secondary | ICD-10-CM | POA: Diagnosis not present

## 2021-11-02 DIAGNOSIS — Z125 Encounter for screening for malignant neoplasm of prostate: Secondary | ICD-10-CM | POA: Diagnosis not present

## 2021-11-02 DIAGNOSIS — I480 Paroxysmal atrial fibrillation: Secondary | ICD-10-CM | POA: Diagnosis not present

## 2021-11-04 DIAGNOSIS — I480 Paroxysmal atrial fibrillation: Secondary | ICD-10-CM | POA: Diagnosis not present

## 2021-11-04 DIAGNOSIS — Z833 Family history of diabetes mellitus: Secondary | ICD-10-CM | POA: Diagnosis not present

## 2021-11-04 DIAGNOSIS — Z0000 Encounter for general adult medical examination without abnormal findings: Secondary | ICD-10-CM | POA: Diagnosis not present

## 2021-11-04 DIAGNOSIS — Z125 Encounter for screening for malignant neoplasm of prostate: Secondary | ICD-10-CM | POA: Diagnosis not present

## 2021-11-04 DIAGNOSIS — Z13220 Encounter for screening for lipoid disorders: Secondary | ICD-10-CM | POA: Diagnosis not present

## 2021-11-18 DIAGNOSIS — F4322 Adjustment disorder with anxiety: Secondary | ICD-10-CM | POA: Diagnosis not present

## 2021-12-16 DIAGNOSIS — F4322 Adjustment disorder with anxiety: Secondary | ICD-10-CM | POA: Diagnosis not present

## 2022-01-22 DIAGNOSIS — F4322 Adjustment disorder with anxiety: Secondary | ICD-10-CM | POA: Diagnosis not present

## 2022-03-02 DIAGNOSIS — F4322 Adjustment disorder with anxiety: Secondary | ICD-10-CM | POA: Diagnosis not present

## 2022-04-22 DIAGNOSIS — F4322 Adjustment disorder with anxiety: Secondary | ICD-10-CM | POA: Diagnosis not present

## 2022-05-27 ENCOUNTER — Telehealth: Payer: Self-pay | Admitting: Cardiology

## 2022-05-27 MED ORDER — METOPROLOL TARTRATE 25 MG PO TABS: 25.0000 mg | ORAL_TABLET | Freq: Two times a day (BID) | ORAL | 0 refills | Status: DC

## 2022-05-27 NOTE — Telephone Encounter (Signed)
*  STAT* If patient is at the pharmacy, call can be transferred to refill team.   1. Which medications need to be refilled? (please list name of each medication and dose if known)   metoprolol tartrate (LOPRESSOR) 25 MG tablet (Expired)    2. Which pharmacy/location (including street and city if local pharmacy) is medication to be sent to? [facility] - [place], [place] - [address]  3. Do they need a 30 day or 90 day supply?  90 day

## 2022-06-10 DIAGNOSIS — F4322 Adjustment disorder with anxiety: Secondary | ICD-10-CM | POA: Diagnosis not present

## 2022-06-18 DIAGNOSIS — H43811 Vitreous degeneration, right eye: Secondary | ICD-10-CM | POA: Diagnosis not present

## 2022-07-15 DIAGNOSIS — F4322 Adjustment disorder with anxiety: Secondary | ICD-10-CM | POA: Diagnosis not present

## 2022-08-12 DIAGNOSIS — L570 Actinic keratosis: Secondary | ICD-10-CM | POA: Diagnosis not present

## 2022-08-12 DIAGNOSIS — L814 Other melanin hyperpigmentation: Secondary | ICD-10-CM | POA: Diagnosis not present

## 2022-08-12 DIAGNOSIS — L821 Other seborrheic keratosis: Secondary | ICD-10-CM | POA: Diagnosis not present

## 2022-08-12 DIAGNOSIS — D225 Melanocytic nevi of trunk: Secondary | ICD-10-CM | POA: Diagnosis not present

## 2022-08-17 ENCOUNTER — Other Ambulatory Visit: Payer: Self-pay | Admitting: Cardiology

## 2022-09-10 DIAGNOSIS — F4322 Adjustment disorder with anxiety: Secondary | ICD-10-CM | POA: Diagnosis not present
